# Patient Record
Sex: Female | Born: 1940
Health system: Southern US, Community
[De-identification: ages and names within clinical notes are randomized; demographics above are authoritative.]

## PROBLEM LIST (undated history)

## (undated) HISTORY — PX: ABDOMINAL HYSTERECTOMY: SHX81

---

## 2000-12-25 ENCOUNTER — Emergency Department (HOSPITAL_COMMUNITY): Admission: EM | Admit: 2000-12-25 | Discharge: 2000-12-25 | Payer: Self-pay | Admitting: Emergency Medicine

## 2000-12-25 ENCOUNTER — Encounter: Payer: Self-pay | Admitting: Emergency Medicine

## 2004-11-18 ENCOUNTER — Encounter
Admission: RE | Admit: 2004-11-18 | Discharge: 2005-02-16 | Payer: Self-pay | Admitting: Physical Medicine & Rehabilitation

## 2004-11-19 ENCOUNTER — Ambulatory Visit: Payer: Self-pay | Admitting: Physical Medicine & Rehabilitation

## 2005-01-16 ENCOUNTER — Ambulatory Visit: Payer: Self-pay | Admitting: Physical Medicine & Rehabilitation

## 2005-01-23 ENCOUNTER — Encounter
Admission: RE | Admit: 2005-01-23 | Discharge: 2005-01-23 | Payer: Self-pay | Admitting: Physical Medicine & Rehabilitation

## 2005-04-01 ENCOUNTER — Encounter
Admission: RE | Admit: 2005-04-01 | Discharge: 2005-06-30 | Payer: Self-pay | Admitting: Physical Medicine & Rehabilitation

## 2005-04-17 ENCOUNTER — Ambulatory Visit: Payer: Self-pay | Admitting: Physical Medicine & Rehabilitation

## 2006-02-22 ENCOUNTER — Ambulatory Visit (HOSPITAL_COMMUNITY): Admission: RE | Admit: 2006-02-22 | Discharge: 2006-02-22 | Payer: Self-pay | Admitting: Pulmonary Disease

## 2007-05-23 ENCOUNTER — Other Ambulatory Visit: Admission: RE | Admit: 2007-05-23 | Discharge: 2007-05-23 | Payer: Self-pay | Admitting: Obstetrics and Gynecology

## 2007-06-09 ENCOUNTER — Encounter: Admission: RE | Admit: 2007-06-09 | Discharge: 2007-06-09 | Payer: Self-pay | Admitting: Obstetrics and Gynecology

## 2008-01-04 ENCOUNTER — Encounter: Admission: RE | Admit: 2008-01-04 | Discharge: 2008-01-05 | Payer: Self-pay | Admitting: Family Medicine

## 2008-02-17 ENCOUNTER — Emergency Department (HOSPITAL_COMMUNITY): Admission: EM | Admit: 2008-02-17 | Discharge: 2008-02-17 | Payer: Self-pay | Admitting: Family Medicine

## 2008-07-28 IMAGING — CR DG ANKLE COMPLETE 3+V*R*
3 series · 3 of 3 positions shown · non-contrast
Comparison: none

CLINICAL DATA: Fell on pavement with pain. 
 RIGHT ANKLE ? 3 VIEW:

[view not recorded (1 of 3)]
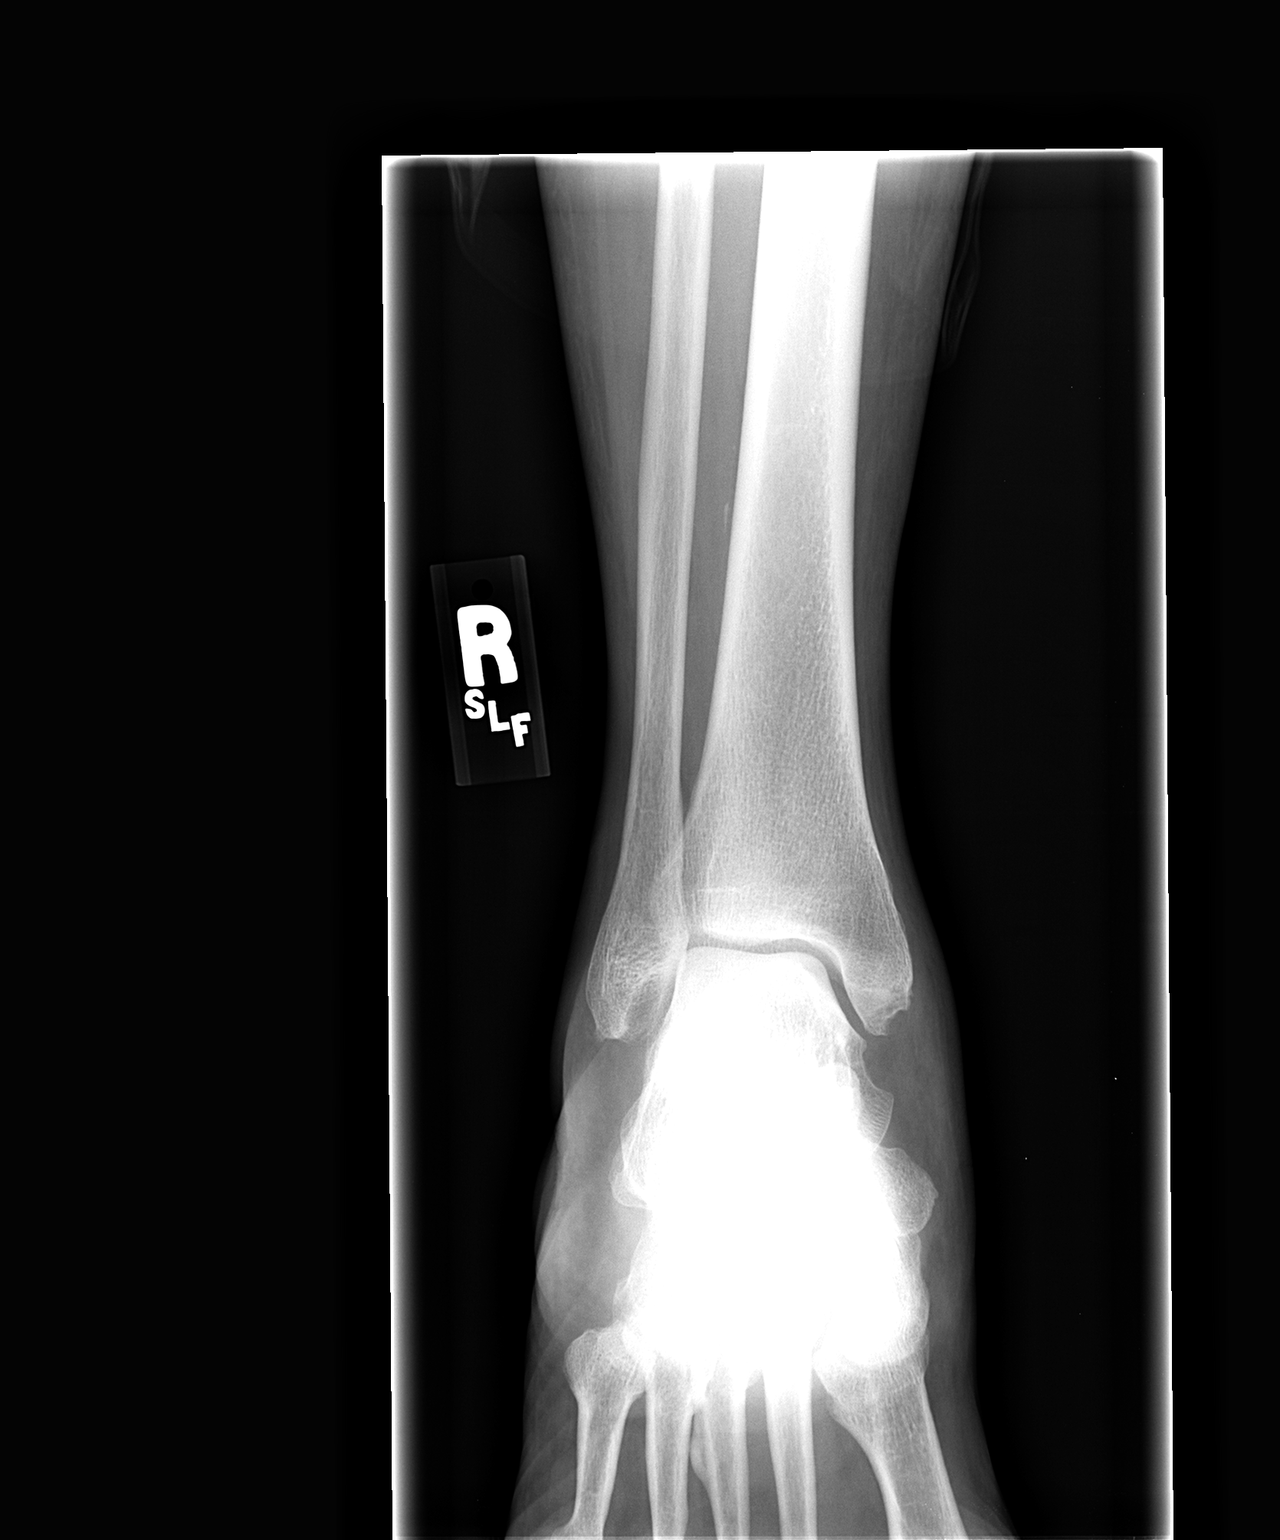

[view not recorded (2 of 3)]
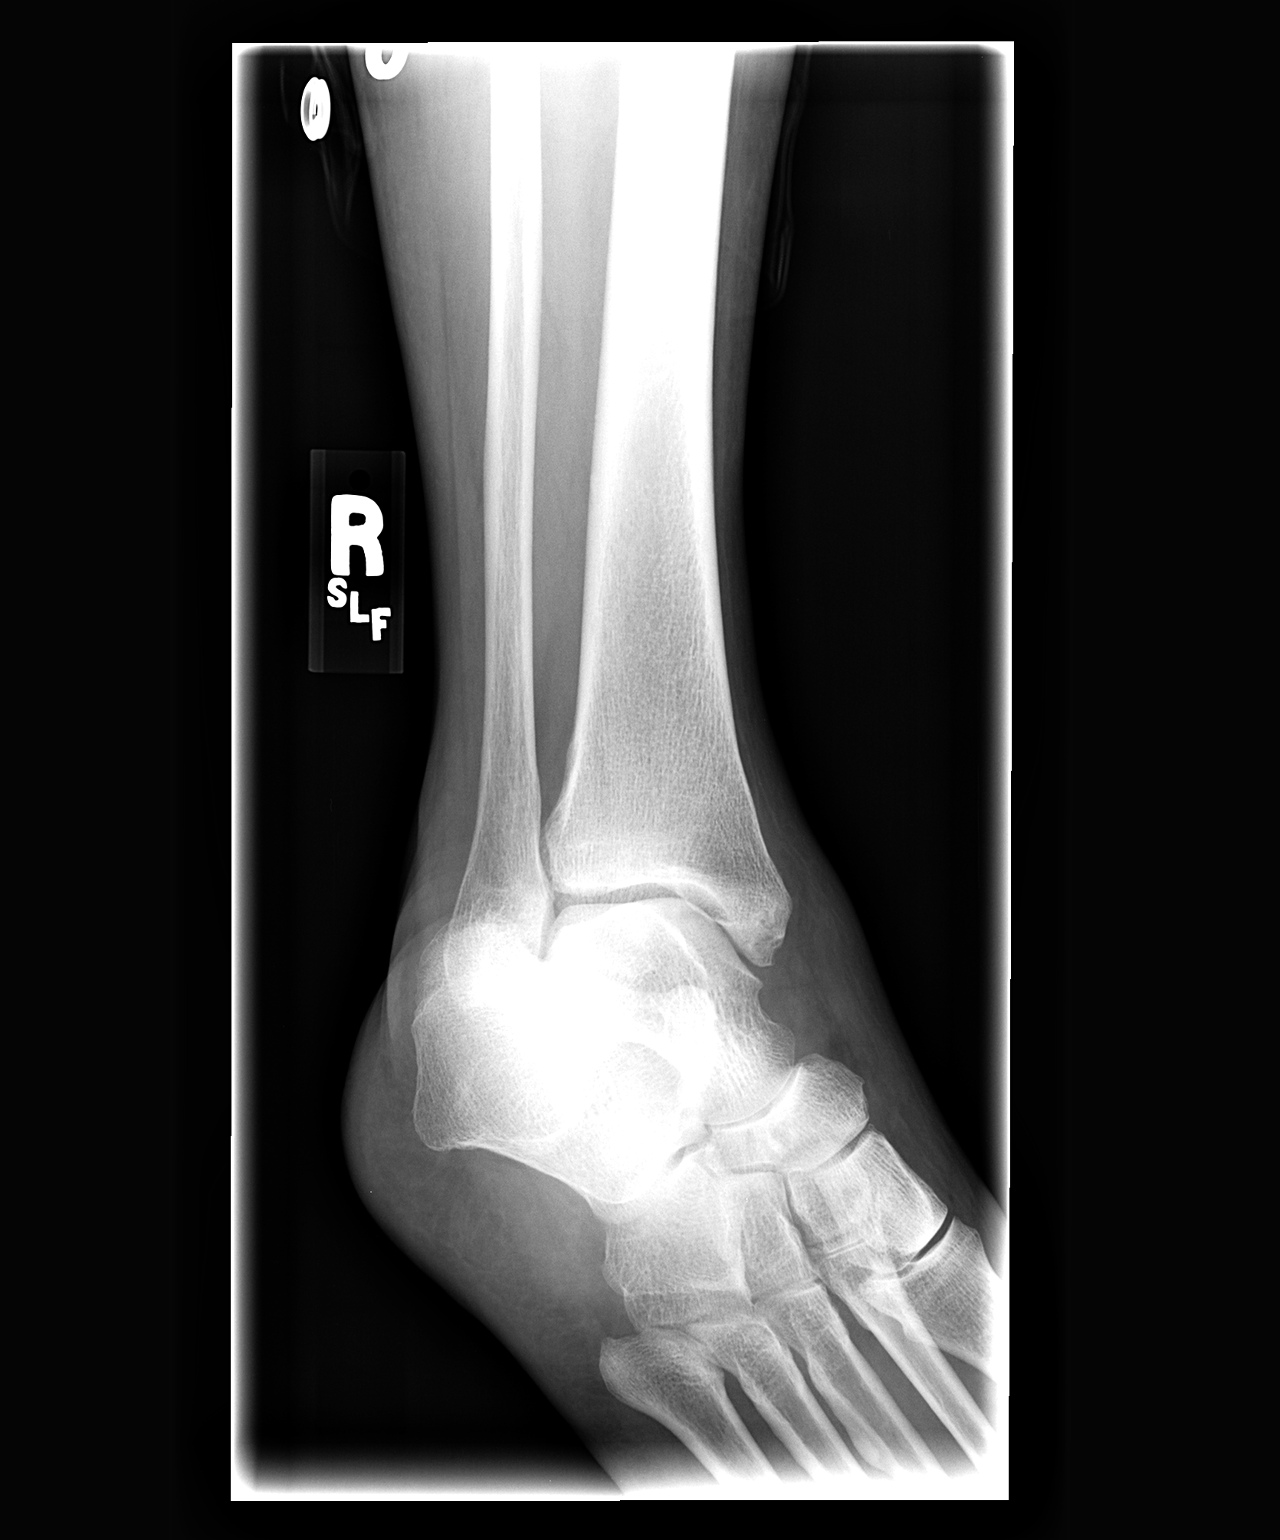

[view not recorded (3 of 3)]
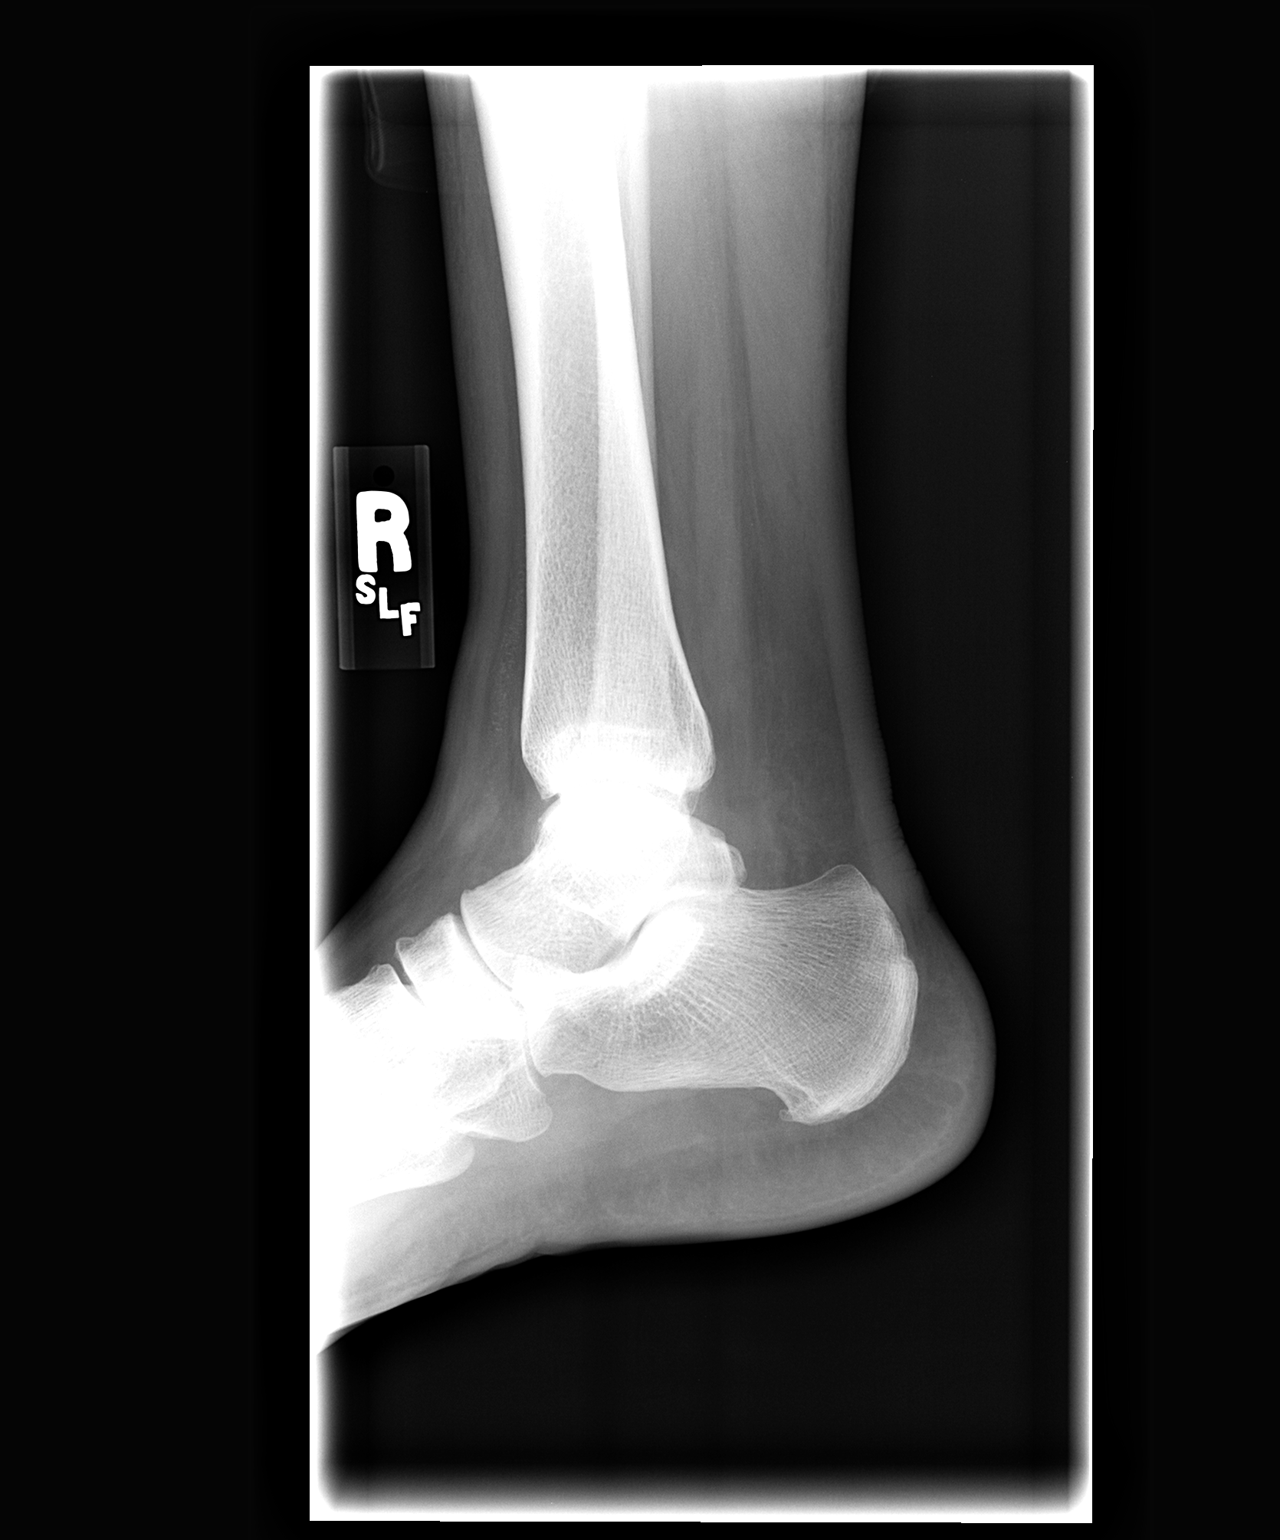

[3 of 3 positions shown; findings below may reference images not displayed]

FINDINGS: Three views of the right ankle show no acute fracture.  The ankle joint appears normal.  Alignment is normal.
IMPRESSION: Negative right ankle.

## 2009-08-03 ENCOUNTER — Emergency Department (HOSPITAL_COMMUNITY): Admission: EM | Admit: 2009-08-03 | Discharge: 2009-08-03 | Payer: Self-pay | Admitting: Emergency Medicine

## 2009-08-16 ENCOUNTER — Ambulatory Visit: Payer: Self-pay | Admitting: Women's Health

## 2009-11-01 ENCOUNTER — Ambulatory Visit: Payer: Self-pay | Admitting: Obstetrics and Gynecology

## 2009-11-06 ENCOUNTER — Ambulatory Visit: Payer: Self-pay | Admitting: Obstetrics and Gynecology

## 2009-11-06 ENCOUNTER — Other Ambulatory Visit: Admission: RE | Admit: 2009-11-06 | Discharge: 2009-11-06 | Payer: Self-pay | Admitting: Obstetrics and Gynecology

## 2011-04-17 NOTE — Assessment & Plan Note (Signed)
[name] returns to the [facility] for followup evaluation. I had seen her  previously in this office twice. Last appointment was December 19, 2004. I  first saw the patient in this office November 19, 2004. During the initial  evaluation, she reported that she was getting reasonable relief with  oxycodone medication which she was taking at night along with Vicodin  through the day. She failed to report to me that she had a fair amount of  medications remaining from her prescription with Dr. Lynnette Caffey, her primary care  physician. I did give her prescriptions during that initial evaluation in  December but she never filled them for Vicodin or oxycodone.  She also  reports that she has lost a prescription and not sure where they are at the  present time.   In the meantime, the patient has used up all pain medicines from Dr. Lynnette Caffey.  She reports that she gets all medicines through the [facility]. She has been out of the oxycodone and more specifically the Percocet  since about a week ago. She was using Percocet 7.5/500, 1 tablet q.i.d.  She  needs a refill on that in the office today. That is her only pain medicine  she takes at the present time and she reports good pain relief.   She also requests a note for work regarding work restrictions and number of  hours that she can work. Will give that to her as noted below.  Unfortunately we have also not been able to set her up physical therapy for  measurements of the lumbar support chair. Apparently specific measurements  need to be made before her employer will be able to get her the proper  chair.   MEDICATIONS:  1.  Per 7.5/500, 1 tablet q.i.d. p.r.n.  2.  Multivitamin q.d.   PHYSICAL EXAMINATION:  A well appearing fit adult female in mild acute  discomfort.  Blood pressure 144/93, pulse 72, respiratory rate 20, O2  saturation 99% on room air.  She has 5-/5 strength throughout the bilateral  upper and lower extremities.   Bulk and tone were normal and reflexes were 2+  and symmetrical.  She ambulates without any assistive device.   IMPRESSION:  1.  Lumbar spondylosis/stenosis per MRI study, August 2005.  2.  History of work related fall, July 2005.   In the office today, we did give her a work slip regarding work greater than  or equal to 5 hours as capable, limited bending and stooping and no lifting  greater than 5 pounds. We have also refilled her Percocet at 7.5/500, 1  tablet q.i.d.  She seems to understand that we need to be very careful with  all medications and need to track the medications well. She will try to  bring in the lost prescriptions if she does ever find them. She still  reports that she used only [employer]. There is a prescription bottle that  gives me four Percocet but it does not have Dr. Trula Ore name on the  prescription bottle. She is not sure how to explain that inconsistency.  Will try to keep tight control on her at the present time. She tries to make  clear to me that she does not abuse medication in any way and I hopefully  will be able to feel more comfortable with her in the near future. In the  meantime will see how she does with the Percocet that I have given her  today.      DC/MedQ  D:  01/16/2005 10:17:25  T:  01/16/2005 11:40:54  Job #:  [id number]

## 2011-04-17 NOTE — Assessment & Plan Note (Signed)
HISTORY OF PRESENT ILLNESS:  Ms. Debes returns to the clinic today for  follow up evaluation.  She is a few minutes late into the office, but we  still decided to see her.  We will have to see her as quick as possible to  stay on schedule.   The patient was last seen here January 16, 2005.  At that time, I gave her  a prescription for Percocet as she was not getting adequate relief with her  prior pain medicine including Vicodin. Unfortunately, I filled out the  prescription wrong and ordered Percocet 7.5/750.  The Tylenol dose does not  come at that strength.  Unfortunately, the pharmacy and the patient did not  call for a correction of the prescription, and instead, the patient used  Extra Strength Tylenol only over the past few months.  She brings in the  unfilled prescription into the office today, and we have destroyed that.  We  will be giving her a correct prescription in the office today.   MEDICATIONS:  1.  Extra Strength Tylenol 500 mg t.i.d. p.r.n.  2.  Multivitamin daily.   PHYSICAL EXAMINATION:  GENERAL APPEARANCE:  Well-appearing, fit, adult  female in no acute discomfort.  VITAL SIGNS:  Blood pressure and vitals were not obtained in the office.  NEUROLOGICAL:  She has 5-/5 strength throughout the bilateral upper and  lower extremities.  Bulk and tone were normal.  Reflexes were 2+ and  symmetrical.   IMPRESSION:  1.  Lumbar spondylosis/stenosis per MRI study August 2005.  2.  History of work related fall July 2005.   PLAN:  In the office today, we did give her a correct prescription for the  Percocet 7.5/500 one tablet p.o. q.i.d. p.r.n.  I have also given her a copy  of our prescription for her work chair along with a particular diagnosis,  and ICD 9 codes.  As stated, this is the chair that the patient needs to  continue work.  Hopefully, Worker's Compensation will get this chair for her  so that she can continue her work at [employer].  We will plan on  seeing the patient in follow up in approximately one month's time.      DC/MedQ  D:  04/17/2005 10:03:42  T:  04/17/2005 10:45:12  Job #:  [id number]

## 2011-04-17 NOTE — Assessment & Plan Note (Signed)
Sierra Becker returns to the clinic today for follow-up evaluation.  I first and  last saw her on November 19, 2004, on referral from Dr. Tally Joe, her primary  care physician.  She was being evaluated and treated for chronic back pain  after a work injury.   During the last clinic visit, the patient reported that she was getting fair  amount of relief with the Oxycodone medication.  She generally used only one  tablet at nighttime as she was worried about grogginess through the day.  We  decided to allow her to use Vicodin on a very sporadic basis up to three  times a day for pain during the day.  We gave her prescriptions for both the  Oxycodone and the Vicodin in the office during the last clinic visit.   The patient comes into the office today reporting that she never did fill  those two prescriptions.  She failed to report to me that she had sufficient  refills of her Oxycodone from Dr. Tally Joe, who had prescribed them for her  previously.  She is using a maximum of one tablet per day and sometimes goes  without any pain medicine.  She does use some Myoflex cream on her left  lower extremity through the day and avoids any sedation or grogginess  related to pain medicines.  She does report adequate relief from the  Oxycodone which she uses generally at night.  She has not brought any of her  pain medicines into the office today.  She is fairly sure that she some  refills on the bottle of Oxycodone that she still has from Dr. Tally Joe.  Again, she did not bring any scripts with her today, specifically the scrips  that were not filled, but were given to her during the last clinic visit.   DISCHARGE MEDICATIONS:  1.  Oxycodone 5 mg one tablet daily p.r.n.  2.  Multivitamin daily p.r.n.  3.  Vicodin (not being used).   PHYSICAL EXAMINATION:  GENERAL:  Reasonably well-appearing adult female in  mild to no acute discomfort.  VITAL SIGNS:  Blood pressure 129/81 with a pulse of 70, respiratory rate  18,  and O2 saturation 100% on room air.  NEUROLOGY:  She has 5-/5 strength throughout the bilateral lower  extremities.  Bulk and tone were normal and reflexes were 2+ and  symmetrical.  Sensation was intact to light touch throughout the bilateral  lower extremities.   IMPRESSION:  1.  Lumbar spondylosis/stenosis per MRI study in August of 2005.  2.  History of work-related fall in July of 2005.   In the clinic today, I have asked the patient to return the unfilled  prescriptions for the Oxycodone and Vicodin that I gave her during the last  clinic visit.  We will need to destroy those when she brings them in.  I  have also asked her to bring in the bottle of the Oxycodone that she is  still using from Dr. Tally Joe so that we can see exactly which pharmacy she  uses and see how many refills she has.  We will need to prescribe medication  for her in the future, but at the present time she seems to have adequate  Oxycodone.  She is not interested in using the Vicodin and I have therefore  asked her to return that prescription so that we can destroy that.  We plan  on seeing the patient in follow-up in approximately one months' time.  We  have asked her also to have a lumbar corset brace and she will be seeking to  get that through a mail-in company.  We have also set her up with physical  therapists for measurements for a lumbar support chair that she will use at  her work space.  Dr. Tally Joe had previously written for the chair, but it was  generic without specific measurements.  Her employer apparently needs  specific measurements so that they can get her the proper chair that she  needs.   We will plan on seeing her in follow-up in approximately one months' time.       DC/MedQ  D:  12/19/2004 11:14:14  T:  12/19/2004 11:35:46  Job #:  [id number]

## 2011-04-17 NOTE — Group Therapy Note (Signed)
REFERRING PHYSICIAN:  Dr. Lynnette Caffey (primary care physician).   PURPOSE OF EVALUATION:  Evaluate and treat chronic back pain after a work  injury.   HISTORY OF PRESENT ILLNESS:  Sierra Becker is a 70 year old adult female  referred to this office by Dr. Lynnette Caffey, her primary care physician, for  treatment of low back pain resulting from a work injury.  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  She reports that  she fell out of a rolling desk chair that had wheels.  She reports that she  was seen at urgent care and x-rays showed some unusual abnormality, although  she is not sure.  She subsequently went on to have an MRI scan of her lumbar  spine done July 09, 2004.  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  Multi level degenerative spondylotic  changes were present.  The lateral recess and foraminal stenosis on the left  at L3-L4 was present, in part due to disk protrusion.  There was also  encroachment on the right L2 nerve root laterally.  There was a small  shallow disk protrusion laterally on the left at L2-L3 and to a greater  degree laterally on the left at L1-L2.  Moderate central canal stenosis was  present at L2-L3 and L3-L4.  The patient reports that after the studies, she  has been tried on a combination of water and land therapy.  She reports that  neither of these gave her much relief.  She also reports having used a TENS  unit but apparently the present TENS unit that she has had one channel that  is not working.  She apparently has also been tried on Ultram along with  Celebrex and a Medrol dose pack along with Percocet in the past.  She also  reports having taken oxycodone initially for 45 days while she was out of  work.  The patient reports that presently she gets  good relief from the  oxycodone that she takes one tablet at night.  She unfortunately can not  work through the day on the oxycodone medication and she needs something  slightly weaker that she can use during the day for pain.  She reports that  she does not want any injections at the present time.  She reports that she  has to change positions frequently and has work restrictions as noted below.  She also reports trouble sitting for any prolonged period of time.  On  October 16, 2004, Dr. Lynnette Caffey saw her and asked her to continue the same work  restrictions, i.e., no bending or stooping, and no lifting greater than 5  pounds.  She was allowed greater than or equal to five hours of work per day  as tolerated.  She generally has been working 5+ hours a day four days per  week at this point.  She has some restrictions as noted above.  Presently,  the patient reports pain across her lumbar region down her left leg and down  her backside but not below knee level.  She also complains of occasional  numbness of her left leg secondary to this pain.  The patient reports that  the TENS unit has given her some relief in the past.  She would like to get  a new TENS  unit if at all possible but that has been slow to proceed through  her worker's comp carrier.  She reports that she has contacted Corky Sox at the [place] office in [place] for most of her worker comp  issues.   MEDICATIONS:  Oxycodone 5 mg 0-1 tablets per day.   PAST MEDICAL HISTORY:  History of oral surgery in the past.   ALLERGIES:  No known drug allergies.   FAMILY HISTORY:  The patient reports congestive heart failure in an elderly  grandparent.  She reports that her father is healthy and working at [employer].  She reports that her mother died at age 29 from unknown  causes.   SOCIAL HISTORY:  The patient works as a Librarian, academic for [employer].  She works  5 to 5+ hours per day four days per week.  She is  married with four grown  children.  She does not use alcohol or tobacco.   REVIEW OF SYSTEMS:  None marked.   PHYSICAL EXAMINATION:  GENERAL:  A well-appearing, fit, adult female in mild  acute discomfort.  VITAL SIGNS:  Blood pressure 118/73 with a pulse of 81, respiratory rate 20,  and O2 saturation 99% on room air.  MUSCULOSKELETAL:  The patient has normal walking abilities.  She does not  use any assistive device during ambulation.  She has some trouble toe  walking and heel walking on the left.  Upper extremity range of motion was  decreased with complaints of pain throughout her entire upper back and mid  back.  Strength was 4/5 throughout the bilateral upper extremities and  reflexes were 2+ and symmetrical.  Sensation was intact to light touch  throughout the bilateral upper extremities.  Lower extremity exam showed  normal bulk and tone throughout.  Reflexes were 2+ and symmetrical and  sensation was intact to light touch throughout.  Strength was 4/5 in hip  flexion, knee extension, and ankle dorsiflexion.  In the supine position,  straight leg raise was negative bilaterally and hip range of motion was  within functional limits.   IMPRESSION:  1.  Lumbar spondylosis/stenosis per MRI study, August 2005.  2.  History of work related fall, June 05, 2004.   At the present time,  1.  We have decided to continue her oxycodone which she uses at night and      gets good relief from.  2.  We have decided to allow her to use Vicodin 5/500 one tablet p.o. t.i.d.      during the day as needed for pain that comes about and for which she can      not take the oxycodone secondary to grogginess.  3.  We will also try to get her a new TENS unit as that has provided her      with benefit in the past.  4.  She is not interested in injections and she reports that therapy has      actually only caused her increased pain and not relieved any pain to any      significant degree. 5.  We will  plan on seeing the patient in followup in this office in      approximately one month's time.      DC/MedQ  D:  11/19/2004 14:37:55  T:  11/19/2004 17:20:02  Job #:  [id number]

## 2011-12-09 ENCOUNTER — Ambulatory Visit (INDEPENDENT_AMBULATORY_CARE_PROVIDER_SITE_OTHER): Payer: BC Managed Care – PPO | Admitting: Women's Health

## 2011-12-09 ENCOUNTER — Telehealth: Payer: Self-pay | Admitting: *Deleted

## 2011-12-09 ENCOUNTER — Encounter: Payer: Self-pay | Admitting: Women's Health

## 2011-12-09 VITALS — BP 138/92 | Ht 65.5 in | Wt 189.0 lb

## 2011-12-09 DIAGNOSIS — Z13220 Encounter for screening for lipoid disorders: Secondary | ICD-10-CM

## 2011-12-09 DIAGNOSIS — B372 Candidiasis of skin and nail: Secondary | ICD-10-CM

## 2011-12-09 DIAGNOSIS — R32 Unspecified urinary incontinence: Secondary | ICD-10-CM

## 2011-12-09 DIAGNOSIS — Z780 Asymptomatic menopausal state: Secondary | ICD-10-CM

## 2011-12-09 DIAGNOSIS — E079 Disorder of thyroid, unspecified: Secondary | ICD-10-CM

## 2011-12-09 DIAGNOSIS — Z01419 Encounter for gynecological examination (general) (routine) without abnormal findings: Secondary | ICD-10-CM

## 2011-12-09 LAB — URINALYSIS, ROUTINE W REFLEX MICROSCOPIC
Bilirubin Urine: NEGATIVE
Hgb urine dipstick: NEGATIVE
Ketones, ur: NEGATIVE mg/dL
Protein, ur: NEGATIVE mg/dL
Urobilinogen, UA: 0.2 mg/dL (ref 0.0–1.0)

## 2011-12-09 LAB — WET PREP, GENITAL
Clue Cells Wet Prep HPF POC: NONE SEEN
Trich, Wet Prep: NONE SEEN

## 2011-12-09 MED ORDER — FLUCONAZOLE 150 MG PO TABS: 150.0000 mg | ORAL_TABLET | Freq: Once | ORAL | Status: AC

## 2011-12-09 NOTE — Telephone Encounter (Signed)
Telephone call, will schedule office visit at [facility] urgent care with another provider. Will have labs faxed prior to OV.

## 2011-12-09 NOTE — Progress Notes (Signed)
Sierra Becker 1941/07/03 [id number]    History:    The patient presents for annual exam.  Has numerous complaints. Has not been seen in our office in 2 years. Has had some problems with urinary incontinence for greater than 4 months, wearing a pad that has caused some skin irritation. Denies any pain at the end of the stream, increased frequency and urgency, nocturia x4-5 times. Also states has had some swelling in her ankles for greater than 4 months with occasional shortness of breath and increased fatigue. Has not seen a primary care. History of a hysterectomy for fibroids. Last mammogram was several years ago, normal. Had a normal colonoscopy in 2010. Has not had a bone density. Currently on no medication other than a vitamin.   Past medical history, past surgical history, family history and social history were all reviewed and documented in the EPIC chart. Works full-time Tree surgeon disability. Mother died at age 23 unknown cause. Father age 76.   ROS:  A  ROS was performed and pertinent positives and negatives are included in the history.  Exam:  Filed Vitals:   12/09/11 0857  BP: 138/92    General appearance:  Normal appears well Head/Neck:  Normal, without cervical or supraclavicular adenopathy. Thyroid:  Symmetrical, normal in size, without palpable masses or nodularity. Respiratory  Effort:  Normal  Auscultation:  Clear without wheezing or rhonchi Cardiovascular  Auscultation:  Regular rate, without rubs, murmurs or gallops  Edema/varicosities:  +1 edema pedal bilat Abdominal  Soft,nontender, without masses, guarding or rebound.  Liver/spleen:  No organomegaly noted  Hernia:  None appreciated  Skin  Inspection:  Grossly normal  Palpation:  Grossly normal Neurologic/psychiatric  Orientation:  Normal with appropriate conversation.  Mood/affect:  Normal  Genitourinary    Breasts: Examined lying and sitting/inverted nipples bilat.     Right: Without masses,  retractions, discharge or axillary adenopathy.     Left: Without masses, retractions, discharge or axillary adenopathy.   Inguinal/mons:  Normal without inguinal adenopathy  External genitalia:  Normal  BUS/Urethra/Skene's glands:  Normal  Bladder:  Normal  Vagina:  Normal  Cervix:               Uterus:  absent  Adnexa/parametria:     Rt: Without masses or tenderness.   Lt: Without masses or tenderness.  Anus and perineum: Normal  Digital rectal exam: Normal sphincter tone without palpated masses or tenderness  Assessment/Plan:  71 y.o. W BF G4 P4 for annual exam.    Urinary incontinence x3-4 months with pruritus Pedal edema/occasional SOB/fatigue greater than 4 months Hysterectomy/fibroids/never ERT. Yeast  Plan: Diflucan 150 by mouth x1 dose, nystatin cream to external genitalia. UA and culture, if negative proceed with urodynamics. Blood pressure 132/92, +1 pedal edema bilaterally, states occasional shortness of breath and fatigue. Reviewed importance of primary care will schedule an appointment at [facility] urgent care ASAP. Also reviewed needed vaccine's of zostovac, Pneumovax, Dtap. Will schedule bone density here, reviewed importance of vitamin D 2000 daily, fall prevention and home safety discussed. CBC, CMET, lipid profile, TSH, UA Harrington Challenger WHNP, 9:54 AM 12/09/2011

## 2011-12-09 NOTE — Telephone Encounter (Signed)
Pt was seen today, she said the PCP(Dr. Pouska)  at [facility] is no longer worker and has not been in years. Pt would like name of another doctor. Please advise

## 2011-12-10 ENCOUNTER — Ambulatory Visit: Payer: Self-pay | Admitting: Women's Health

## 2011-12-10 ENCOUNTER — Telehealth: Payer: Self-pay | Admitting: Women's Health

## 2011-12-10 DIAGNOSIS — N390 Urinary tract infection, site not specified: Secondary | ICD-10-CM

## 2011-12-10 LAB — COMPREHENSIVE METABOLIC PANEL
ALT: 15 U/L (ref 0–35)
AST: 20 U/L (ref 0–37)
Albumin: 4.4 g/dL (ref 3.5–5.2)
Alkaline Phosphatase: 77 U/L (ref 39–117)
Potassium: 3.4 mEq/L — ABNORMAL LOW (ref 3.5–5.3)
Sodium: 141 mEq/L (ref 135–145)
Total Bilirubin: 0.6 mg/dL (ref 0.3–1.2)
Total Protein: 7.7 g/dL (ref 6.0–8.3)

## 2011-12-10 LAB — CBC WITH DIFFERENTIAL/PLATELET
Basophils Absolute: 0 10*3/uL (ref 0.0–0.1)
Eosinophils Absolute: 0.1 10*3/uL (ref 0.0–0.7)
Eosinophils Relative: 3 % (ref 0–5)
MCH: 28.1 pg (ref 26.0–34.0)
MCHC: 30.9 g/dL (ref 30.0–36.0)
MCV: 90.8 fL (ref 78.0–100.0)
Monocytes Absolute: 0.4 10*3/uL (ref 0.1–1.0)
Platelets: 286 10*3/uL (ref 150–400)
RDW: 13.8 % (ref 11.5–15.5)
WBC: 4.8 10*3/uL (ref 4.0–10.5)

## 2011-12-10 LAB — LIPID PANEL
HDL: 63 mg/dL (ref >39)
LDL Cholesterol: 132 mg/dL — ABNORMAL HIGH (ref 0–99)
Total CHOL/HDL Ratio: 3.3 Ratio

## 2011-12-10 MED ORDER — NITROFURANTOIN MONOHYD MACRO 100 MG PO CAPS: 100.0000 mg | ORAL_CAPSULE | Freq: Two times a day (BID) | ORAL | Status: AC

## 2011-12-10 NOTE — Telephone Encounter (Signed)
Left message on cell urine culture positive, will call in medication to pharmacy, start today take twice daily with food.

## 2011-12-14 ENCOUNTER — Ambulatory Visit: Payer: Self-pay | Admitting: Internal Medicine

## 2011-12-24 ENCOUNTER — Other Ambulatory Visit: Payer: Self-pay | Admitting: Women's Health

## 2011-12-24 DIAGNOSIS — N390 Urinary tract infection, site not specified: Secondary | ICD-10-CM

## 2012-01-13 ENCOUNTER — Other Ambulatory Visit: Payer: Self-pay | Admitting: Obstetrics and Gynecology

## 2012-01-13 DIAGNOSIS — Z13820 Encounter for screening for osteoporosis: Secondary | ICD-10-CM

## 2013-02-09 ENCOUNTER — Emergency Department (HOSPITAL_COMMUNITY)
Admission: EM | Admit: 2013-02-09 | Discharge: 2013-02-09 | Disposition: A | Discharge status: Home / Self Care | Payer: Federal, State, Local not specified - PPO | Attending: Emergency Medicine | Admitting: Emergency Medicine

## 2013-02-09 ENCOUNTER — Encounter (HOSPITAL_COMMUNITY): Payer: Self-pay | Admitting: *Deleted

## 2013-02-09 DIAGNOSIS — Y92410 Unspecified street and highway as the place of occurrence of the external cause: Secondary | ICD-10-CM | POA: Insufficient documentation

## 2013-02-09 DIAGNOSIS — S43499A Other sprain of unspecified shoulder joint, initial encounter: Secondary | ICD-10-CM | POA: Insufficient documentation

## 2013-02-09 DIAGNOSIS — S0990XA Unspecified injury of head, initial encounter: Secondary | ICD-10-CM | POA: Insufficient documentation

## 2013-02-09 DIAGNOSIS — R209 Unspecified disturbances of skin sensation: Secondary | ICD-10-CM | POA: Insufficient documentation

## 2013-02-09 DIAGNOSIS — Y9389 Activity, other specified: Secondary | ICD-10-CM | POA: Insufficient documentation

## 2013-02-09 DIAGNOSIS — S0993XA Unspecified injury of face, initial encounter: Secondary | ICD-10-CM | POA: Insufficient documentation

## 2013-02-09 DIAGNOSIS — IMO0002 Reserved for concepts with insufficient information to code with codable children: Secondary | ICD-10-CM | POA: Insufficient documentation
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
## 2013-02-10 NOTE — ED Provider Notes (Signed)
Medical screening examination/treatment/procedure(s) were performed by non-physician practitioner and as supervising physician I was immediately available for consultation/collaboration.   Dione Booze, MD 02/10/13 [phone]

## 2014-10-01 ENCOUNTER — Encounter (HOSPITAL_COMMUNITY): Payer: Self-pay | Admitting: *Deleted

## 2015-03-15 ENCOUNTER — Other Ambulatory Visit: Payer: Self-pay | Admitting: Family Medicine

## 2015-03-15 DIAGNOSIS — Z1231 Encounter for screening mammogram for malignant neoplasm of breast: Secondary | ICD-10-CM

## 2015-03-15 DIAGNOSIS — Z780 Asymptomatic menopausal state: Secondary | ICD-10-CM

## 2015-03-21 ENCOUNTER — Ambulatory Visit: Payer: Federal, State, Local not specified - PPO

## 2015-03-21 ENCOUNTER — Other Ambulatory Visit: Payer: Federal, State, Local not specified - PPO

## 2015-04-11 ENCOUNTER — Inpatient Hospital Stay: Admission: RE | Admit: 2015-04-11 | Payer: Federal, State, Local not specified - PPO | Source: Ambulatory Visit

## 2015-04-11 ENCOUNTER — Ambulatory Visit: Payer: Federal, State, Local not specified - PPO

## 2016-03-11 ENCOUNTER — Ambulatory Visit: Payer: Federal, State, Local not specified - PPO | Admitting: Family Medicine

## 2016-04-28 DIAGNOSIS — K219 Gastro-esophageal reflux disease without esophagitis: Secondary | ICD-10-CM | POA: Insufficient documentation

## 2018-02-18 ENCOUNTER — Other Ambulatory Visit: Payer: Self-pay

## 2018-02-18 ENCOUNTER — Ambulatory Visit
Admission: EM | Admit: 2018-02-18 | Discharge: 2018-02-18 | Disposition: A | Discharge status: Home / Self Care | Payer: Medicare Other | Attending: Family Medicine | Admitting: Family Medicine

## 2018-02-18 DIAGNOSIS — D649 Anemia, unspecified: Secondary | ICD-10-CM | POA: Insufficient documentation

## 2018-02-18 DIAGNOSIS — E538 Deficiency of other specified B group vitamins: Secondary | ICD-10-CM | POA: Insufficient documentation

## 2018-02-18 DIAGNOSIS — S161XXA Strain of muscle, fascia and tendon at neck level, initial encounter: Secondary | ICD-10-CM

## 2018-02-18 MED ORDER — IBUPROFEN 600 MG PO TABS
600.0000 mg | ORAL_TABLET | Freq: Once | ORAL | Status: AC
  Administered 2018-02-18: 600 mg via ORAL

## 2018-02-18 MED ORDER — CYCLOBENZAPRINE HCL 5 MG PO TABS: 5.0000 mg | ORAL_TABLET | Freq: Every day | ORAL | 0 refills | Status: AC

## 2018-02-18 NOTE — ED Triage Notes (Signed)
Patient complains of neck pain. Patient states that it is along the left side. Patient states that she is unable to turn neck.

## 2018-02-18 NOTE — ED Provider Notes (Signed)
[facility]    CSN: [id number] Arrival date & time: 02/18/18  1047     History   Chief Complaint Chief Complaint  Patient presents with  . Neck Pain    HPI Sierra Becker is a 77 y.o. female.   The history is provided by the patient.  Neck Pain  Pain location:  L side Quality:  Aching Pain radiates to:  L shoulder Pain severity:  Moderate Pain is:  Same all the time Onset quality:  Sudden Duration:  1 day Timing:  Constant Progression:  Unchanged Chronicity:  New Context: not fall, not jumping from heights, not lifting a heavy object, not MCA, not MVC, not pedestrian accident and not recent injury   Relieved by:  None tried Worsened by:  Twisting and position Ineffective treatments:  None tried Associated symptoms: no bladder incontinence, no bowel incontinence, no chest pain, no fever, no headaches, no leg pain, no numbness, no paresis, no photophobia, no syncope, no tingling, no visual change, no weakness and no weight loss   Risk factors: no hx of spinal trauma, no recent head injury and no recurrent falls     History reviewed. No pertinent past medical history.  Patient Active Problem List   Diagnosis Date Noted  . Deficiency of other specified B group vitamins 02/18/2018  . Anemia, unspecified 02/18/2018  . Gastro-esophageal reflux disease without esophagitis 04/28/2016    Past Surgical History:  Procedure Laterality Date  . ABDOMINAL HYSTERECTOMY      OB History    Gravida  4   Para  4   Term      Preterm      AB      Living        SAB      TAB      Ectopic      Multiple      Live Births               Home Medications    Prior to Admission medications   Medication Sig Start Date End Date Taking? Authorizing Provider  hydrocortisone 2.5 % lotion Apply topically. 05/25/17 05/25/18 Yes [provider]  Multiple Vitamins-Minerals (MULTIVITAL PO) Take by mouth.   Yes [provider]  cyclobenzaprine  (FLEXERIL) 5 MG tablet Take 1 tablet (5 mg total) by mouth at bedtime. 02/18/18   Payton Mccallumonty, Cassady Turano, MD  Multiple Vitamins-Minerals (MULTIVITAMIN PO) Take 1 tablet by mouth daily.     [provider]    Family History Family History  Problem Relation Age of Onset  . Diabetes Maternal Aunt   . Diabetes Maternal Grandmother   . Heart disease Father     Social History Social History   Tobacco Use  . Smoking status: Never Smoker  . Smokeless tobacco: Never Used  Substance Use Topics  . Alcohol use: No  . Drug use: Never     Allergies   Other   Review of Systems Review of Systems  Constitutional: Negative for fever and weight loss.  Eyes: Negative for photophobia.  Cardiovascular: Negative for chest pain and syncope.  Gastrointestinal: Negative for bowel incontinence.  Genitourinary: Negative for bladder incontinence.  Musculoskeletal: Positive for neck pain.  Neurological: Negative for tingling, weakness, numbness and headaches.     Physical Exam Triage Vital Signs ED Triage Vitals  Enc Vitals Group     BP 02/18/18 1103 (!) 161/83     Pulse Rate 02/18/18 1103 90     Resp 02/18/18  1103 18     Temp 02/18/18 1103 98.5 F (36.9 C)     Temp Source 02/18/18 1103 Oral     SpO2 02/18/18 1103 99 %     Weight 02/18/18 1100 175 lb (79.4 kg)     Height 02/18/18 1100 5' 5.5" (1.664 m)     Head Circumference --      Peak Flow --      Pain Score 02/18/18 1100 10     Pain Loc --      Pain Edu? --      Excl. in GC? --    No data found.  Updated Vital Signs BP (!) 161/83 (BP Location: Left Arm)   Pulse 90   Temp 98.5 F (36.9 C) (Oral)   Resp 18   Ht 5' 5.5" (1.664 m)   Wt 175 lb (79.4 kg)   SpO2 99%   BMI 28.68 kg/m   Visual Acuity Right Eye Distance:   Left Eye Distance:   Bilateral Distance:    Right Eye Near:   Left Eye Near:    Bilateral Near:     Physical Exam  Constitutional: She appears well-developed and well-nourished. No distress.    Musculoskeletal:       Cervical back: She exhibits decreased range of motion, tenderness and spasm. She exhibits no bony tenderness, no swelling, no edema, no deformity, no laceration, no pain and normal pulse.  Skin: She is not diaphoretic.  Nursing note and vitals reviewed.    UC Treatments / Results  Labs (all labs ordered are listed, but only abnormal results are displayed) Labs Reviewed - No data to display  EKG  EKG Interpretation None       Radiology No results found.  Procedures Procedures (including critical care time)  Medications Ordered in UC Medications  ibuprofen (ADVIL,MOTRIN) tablet 600 mg (600 mg Oral Given 02/18/18 1223)     Initial Impression / Assessment and Plan / UC Course  I have reviewed the triage vital signs and the nursing notes.  Pertinent labs & imaging results that were available during my care of the patient were reviewed by me and considered in my medical decision making (see chart for details).       Final Clinical Impressions(s) / UC Diagnoses   Final diagnoses:  Acute strain of neck muscle, initial encounter    ED Discharge Orders        Ordered    cyclobenzaprine (FLEXERIL) 5 MG tablet  Daily at bedtime     02/18/18 1220     1. diagnosis reviewed with patient 2. rx as per orders above; reviewed possible side effects, interactions, risks and benefits  3. Recommend supportive treatment with rest, otc nsaids prn, heat, stretch 4. Follow-up prn if symptoms worsen or don't improve  Controlled Substance Prescriptions [place] Controlled Substance Registry consulted? Not Applicable   Payton Mccallum, MD 02/18/18 1259

## 2018-02-18 NOTE — Discharge Instructions (Addendum)
Advil ( 3 tablets three times daily as needed for pain), take with food Heat to the area

## 2018-03-30 DIAGNOSIS — M67371 Transient synovitis, right ankle and foot: Secondary | ICD-10-CM | POA: Diagnosis not present

## 2018-03-30 DIAGNOSIS — M19071 Primary osteoarthritis, right ankle and foot: Secondary | ICD-10-CM | POA: Diagnosis not present

## 2018-03-30 DIAGNOSIS — M659 Synovitis and tenosynovitis, unspecified: Secondary | ICD-10-CM | POA: Diagnosis not present

## 2018-06-16 DIAGNOSIS — M79641 Pain in right hand: Secondary | ICD-10-CM | POA: Diagnosis not present

## 2018-06-16 DIAGNOSIS — M1280 Other specific arthropathies, not elsewhere classified, unspecified site: Secondary | ICD-10-CM | POA: Diagnosis not present

## 2018-06-23 DIAGNOSIS — R51 Headache: Secondary | ICD-10-CM | POA: Diagnosis not present

## 2018-06-23 DIAGNOSIS — M542 Cervicalgia: Secondary | ICD-10-CM | POA: Diagnosis not present

## 2018-06-27 DIAGNOSIS — M25512 Pain in left shoulder: Secondary | ICD-10-CM | POA: Diagnosis not present

## 2018-06-27 DIAGNOSIS — R51 Headache: Secondary | ICD-10-CM | POA: Diagnosis not present

## 2018-07-29 ENCOUNTER — Ambulatory Visit
Admission: EM | Admit: 2018-07-29 | Discharge: 2018-07-29 | Disposition: A | Discharge status: Home / Self Care | Payer: Medicare Other | Attending: Family Medicine | Admitting: Family Medicine

## 2018-07-29 DIAGNOSIS — R21 Rash and other nonspecific skin eruption: Secondary | ICD-10-CM

## 2018-07-29 MED ORDER — TRIAMCINOLONE ACETONIDE 0.025 % EX OINT: 1.0000 "application " | TOPICAL_OINTMENT | Freq: Two times a day (BID) | CUTANEOUS | 0 refills | Status: AC

## 2018-07-29 NOTE — ED Provider Notes (Addendum)
[facility] ____________________________________________  Time seen: Approximately 4:31 PM  I have reviewed the triage vital signs and the nursing notes.   HISTORY  Chief Complaint Rash   HPI Sierra Becker is a 77 y.o. female presenting for evaluation of rash that is present to her neck and face intermittently for several months.  States that she feels that this is triggered by heat as it only occurs when she gets over heated.  States that the rash improves with topical hydrocortisone and that does help with the itching, but itching does continue as well as rash.  Denies any changes in foods, medicines, lotions, detergents or other contacts.  States the rash will fully resolve on its own but returns after getting overheated.  States rash is nowhere else on the body. States rash started today. No insect bites or break in skin.  States this has been present intermittently throughout the summer as well as possibly last year.  States did also try some other over-the-counter topical anti-itch cream which helps slightly but similar to hydrocortisone.  Denies any other alleviating measures attempted.  Has not taken any oral agents.  Denies any associated swelling, pain or drainage.  No sore throat, oropharyngeal edema.  States otherwise feels well denies other complaints. Denies recent sickness. Denies recent antibiotic use.  States that she does have eczema history is child with some similar rash.  [place]: PCP   History reviewed. No pertinent past medical history.  Patient Active Problem List   Diagnosis Date Noted  . Deficiency of other specified B group vitamins 02/18/2018  . Anemia, unspecified 02/18/2018  . Gastro-esophageal reflux disease without esophagitis 04/28/2016    Past Surgical History:  Procedure Laterality Date  . ABDOMINAL HYSTERECTOMY       No current facility-administered medications for this encounter.   Current Outpatient Medications:   Marland Kitchen.  Multiple Vitamins-Minerals (MULTIVITAMIN PO), Take 1 tablet by mouth daily. , Disp: , Rfl:  .  cyclobenzaprine (FLEXERIL) 5 MG tablet, Take 1 tablet (5 mg total) by mouth at bedtime., Disp: 30 tablet, Rfl: 0 .  Multiple Vitamins-Minerals (MULTIVITAL PO), Take by mouth., Disp: , Rfl:  .  triamcinolone (KENALOG) 0.025 % ointment, Apply 1 application topically 2 (two) times daily., Disp: 30 g, Rfl: 0  Allergies Other  Family History  Problem Relation Age of Onset  . Diabetes Maternal Aunt   . Diabetes Maternal Grandmother   . Heart disease Father     Social History Social History   Tobacco Use  . Smoking status: Never Smoker  . Smokeless tobacco: Never Used  Substance Use Topics  . Alcohol use: No  . Drug use: Never    Review of Systems Constitutional: No fever/chills ENT: No sore throat. Cardiovascular: Denies chest pain. Respiratory: Denies shortness of breath. Gastrointestinal: No abdominal pain.   Skin: as above.  ____________________________________________   PHYSICAL EXAM:  VITAL SIGNS: ED Triage Vitals  Enc Vitals Group     BP 07/29/18 1538 139/84     Pulse Rate 07/29/18 1538 80     Resp 07/29/18 1538 18     Temp 07/29/18 1538 98.5 F (36.9 C)     Temp Source 07/29/18 1538 Oral     SpO2 07/29/18 1538 97 %     Weight --      Height --      Head Circumference --      Peak Flow --      Pain Score 07/29/18 1540 0  Pain Loc --      Pain Edu? --      Excl. in GC? --     Constitutional: Alert and oriented. Well appearing and in no acute distress. Eyes: Conjunctivae are normal.  ENT      Head: Normocephalic and atraumatic.  No edema.      Nose: No congestion      Mouth/Throat: Mucous membranes are moist.Oropharynx non-erythematous.  No lip, tongue, oropharyngeal edema noted. Neck: No stridor. Supple without meningismus. . Cardiovascular: Normal rate, regular rhythm. Grossly normal heart sounds.  Good peripheral circulation. Respiratory: Normal  respiratory effort without tachypnea nor retractions. Breath sounds are clear and equal bilaterally. No wheezes, rales, rhonchi. Musculoskeletal: Steady gait.  No extremity edema noted. Neurologic:  Normal speech and language.  Speech is normal. No gait instability.  Skin:  Skin is warm, dry.  Except: Very few scattered to neck as well as face minimally erythematous papular rash with small clustered area on chin, diffuse bilaterally but scant, nontender, pruritic, no drainage, nonvesicular, no other rash noted. Psychiatric: Mood and affect are normal. Speech and behavior are normal. Patient exhibits appropriate insight and judgment   ___________________________________________   LABS (all labs ordered are listed, but only abnormal results are displayed)  Labs Reviewed - No data to display   PROCEDURES Procedures    INITIAL IMPRESSION / ASSESSMENT AND PLAN / ED COURSE  Pertinent labs & imaging results that were available during my care of the patient were reviewed by me and considered in my medical decision making (see chart for details).  Very well-appearing patient.  No acute distress.  Patient presenting for evaluation of itchy rash that has been coming and going intermittently.  Patient denies any allergy triggers, but states that associated to when she gets overheated. Patient with very mild appearing rash, and patient further states that rash will eventually go away, but it is just red itchy.  Will treat with topical triamcinolone as needed.  Encourage supportive care and avoidance of scratching as well as to continue to monitor for triggers.  Patient declines oral agents at this time. Discussed indication, risks and benefits of medications with patient.  Discussed follow up with Primary care physician this week as needed. Discussed follow up and return parameters including no resolution or any worsening concerns. Patient verbalized understanding and agreed to plan.    ____________________________________________   FINAL CLINICAL IMPRESSION(S) / ED DIAGNOSES  Final diagnoses:  Rash     ED Discharge Orders         Ordered    triamcinolone (KENALOG) 0.025 % ointment  2 times daily     07/29/18 1617           Note: This dictation was prepared with Dragon dictation along with smaller phrase technology. Any transcriptional errors that result from this process are unintentional.        Renford Dills, NP 07/29/18 [phone]

## 2018-07-29 NOTE — Discharge Instructions (Signed)
Use  medication as prescribed. Avoid scratching.  Follow up with your primary care physician this week as needed. Return to Urgent care for new or worsening concerns.   

## 2018-07-29 NOTE — ED Triage Notes (Signed)
Pt here for rash on her neck and face that she constantly has problems with when it gets hot outside. Did try otc hydrocortisone without relief. Does state it itches.

## 2018-10-06 DIAGNOSIS — M7671 Peroneal tendinitis, right leg: Secondary | ICD-10-CM | POA: Diagnosis not present

## 2018-10-06 DIAGNOSIS — M67371 Transient synovitis, right ankle and foot: Secondary | ICD-10-CM | POA: Diagnosis not present

## 2018-10-06 DIAGNOSIS — M659 Synovitis and tenosynovitis, unspecified: Secondary | ICD-10-CM | POA: Diagnosis not present

## 2019-01-09 ENCOUNTER — Telehealth (INDEPENDENT_AMBULATORY_CARE_PROVIDER_SITE_OTHER): Payer: Self-pay | Admitting: Orthopedic Surgery

## 2019-01-09 NOTE — Telephone Encounter (Signed)
Patient came into the office wanted to inform Dr.Duda that its a possibility we will receive [facility] paperwork for her husband. She did state they didn't specify who they sent it to. She is thinking since Dr.Duda treated him last it might come to [place]. Patient would like to speak with Dr.Duda please call patient to advise.

## 2019-01-11 NOTE — Telephone Encounter (Signed)
I called pt and there was no answer not able to leave a voicemail. I will hold message and try again later.

## 2019-01-13 NOTE — Telephone Encounter (Signed)
Unable to reach pt or leave a message will sign off and await return call.

## 2019-01-17 ENCOUNTER — Ambulatory Visit (INDEPENDENT_AMBULATORY_CARE_PROVIDER_SITE_OTHER): Payer: Medicare Other

## 2019-01-17 ENCOUNTER — Ambulatory Visit (INDEPENDENT_AMBULATORY_CARE_PROVIDER_SITE_OTHER): Payer: Medicare Other | Admitting: Orthopedic Surgery

## 2019-01-17 ENCOUNTER — Encounter (INDEPENDENT_AMBULATORY_CARE_PROVIDER_SITE_OTHER): Payer: Self-pay | Admitting: Orthopedic Surgery

## 2019-01-17 VITALS — Ht 65.5 in | Wt 175.0 lb

## 2019-01-17 DIAGNOSIS — M25571 Pain in right ankle and joints of right foot: Secondary | ICD-10-CM

## 2019-01-17 DIAGNOSIS — M79671 Pain in right foot: Secondary | ICD-10-CM

## 2019-01-23 ENCOUNTER — Encounter (INDEPENDENT_AMBULATORY_CARE_PROVIDER_SITE_OTHER): Payer: Self-pay | Admitting: Orthopedic Surgery

## 2019-01-23 DIAGNOSIS — M25571 Pain in right ankle and joints of right foot: Secondary | ICD-10-CM | POA: Diagnosis not present

## 2019-01-23 DIAGNOSIS — M79671 Pain in right foot: Secondary | ICD-10-CM | POA: Diagnosis not present

## 2019-01-23 MED ORDER — LIDOCAINE HCL 1 % IJ SOLN
2.0000 mL | INTRAMUSCULAR | Status: AC | PRN
  Administered 2019-01-23: 2 mL

## 2019-01-23 MED ORDER — METHYLPREDNISOLONE ACETATE 40 MG/ML IJ SUSP
40.0000 mg | INTRAMUSCULAR | Status: AC | PRN
  Administered 2019-01-23: 40 mg via INTRA_ARTICULAR

## 2019-01-23 NOTE — Progress Notes (Signed)
Office Visit Note   Patient: Sierra Becker           Date of Birth: 08/16/1941           MRN: [id number] Visit Date: 01/17/2019              Requested by: [facility] [address] [place], [place] [address] PCP: [facility]  Chief Complaint  Patient presents with  . Right Ankle - Pain      HPI: Patient is a 78 year old woman who complains of right ankle pain that gives out while walking pain anteriorly to the ankle.  She states this is been a chronic problem she is not taking any medications she states she cannot walk for long distance.  She states she feels like she is going to fall.  Assessment & Plan: Visit Diagnoses:  1. Right foot pain   2. Pain in right ankle and joints of right foot     Plan: Ankle was injected will follow-up in 4 weeks.  Follow-Up Instructions: Return in about 4 weeks (around 02/14/2019).   Ortho Exam  Patient is alert, oriented, no adenopathy, well-dressed, normal affect, normal respiratory effort. On examination patient has good pulses she has a stable anterior drawer she has good ankle and subtalar motion.  She is tender to palpation anteriorly over the ankle.  Imaging: No results found. No images are attached to the encounter.  Labs: Lab Results  Component Value Date   LABORGA ESCHERICHIA COLI 12/09/2011     Lab Results  Component Value Date   ALBUMIN 4.4 12/09/2011    Body mass index is 28.68 kg/m.  Orders:  Orders Placed This Encounter  Procedures  . XR Ankle 2 Views Right   No orders of the defined types were placed in this encounter.    Procedures: Medium Joint Inj: R ankle on 01/23/2019 9:24 AM Indications: pain and diagnostic evaluation Details: 22 G 1.5 in needle, anteromedial approach Medications: 2 mL lidocaine 1 %; 40 mg methylPREDNISolone acetate 40 MG/ML Outcome: tolerated well, no immediate complications Procedure, treatment alternatives, risks and benefits explained, specific  risks discussed. Consent was given by the patient. Immediately prior to procedure a time out was called to verify the correct patient, procedure, equipment, support staff and site/side marked as required. Patient was prepped and draped in the usual sterile fashion.      Clinical Data: No additional findings.  ROS:  All other systems negative, except as noted in the HPI. Review of Systems  Objective: Vital Signs: Ht 5' 5.5" (1.664 m)   Wt 175 lb (79.4 kg)   BMI 28.68 kg/m   Specialty Comments:  No specialty comments available.  PMFS History: Patient Active Problem List   Diagnosis Date Noted  . Deficiency of other specified B group vitamins 02/18/2018  . Anemia, unspecified 02/18/2018  . Gastro-esophageal reflux disease without esophagitis 04/28/2016   History reviewed. No pertinent past medical history.  Family History  Problem Relation Age of Onset  . Diabetes Maternal Aunt   . Diabetes Maternal Grandmother   . Heart disease Father     Past Surgical History:  Procedure Laterality Date  . ABDOMINAL HYSTERECTOMY     Social History   Occupational History  . Not on file  Tobacco Use  . Smoking status: Never Smoker  . Smokeless tobacco: Never Used  Substance and Sexual Activity  . Alcohol use: No  . Drug use: Never  . Sexual activity: Not  Currently    Birth control/protection: Surgical    Comment: HYSTERECTOMY

## 2019-02-10 DIAGNOSIS — D649 Anemia, unspecified: Secondary | ICD-10-CM | POA: Diagnosis not present

## 2019-02-16 ENCOUNTER — Ambulatory Visit (INDEPENDENT_AMBULATORY_CARE_PROVIDER_SITE_OTHER): Payer: Medicare Other | Admitting: Orthopedic Surgery

## 2019-04-03 DIAGNOSIS — G5791 Unspecified mononeuropathy of right lower limb: Secondary | ICD-10-CM | POA: Diagnosis not present

## 2019-04-03 DIAGNOSIS — M659 Synovitis and tenosynovitis, unspecified: Secondary | ICD-10-CM | POA: Diagnosis not present

## 2019-04-03 DIAGNOSIS — M67371 Transient synovitis, right ankle and foot: Secondary | ICD-10-CM | POA: Diagnosis not present

## 2019-04-14 DIAGNOSIS — M67371 Transient synovitis, right ankle and foot: Secondary | ICD-10-CM | POA: Diagnosis not present

## 2019-04-14 DIAGNOSIS — M7751 Other enthesopathy of right foot: Secondary | ICD-10-CM | POA: Diagnosis not present

## 2019-06-30 ENCOUNTER — Other Ambulatory Visit: Payer: Self-pay

## 2019-08-03 DIAGNOSIS — K449 Diaphragmatic hernia without obstruction or gangrene: Secondary | ICD-10-CM | POA: Diagnosis not present

## 2019-08-03 DIAGNOSIS — K210 Gastro-esophageal reflux disease with esophagitis: Secondary | ICD-10-CM | POA: Diagnosis not present

## 2019-09-13 DIAGNOSIS — R1110 Vomiting, unspecified: Secondary | ICD-10-CM | POA: Diagnosis not present

## 2019-09-13 DIAGNOSIS — K219 Gastro-esophageal reflux disease without esophagitis: Secondary | ICD-10-CM | POA: Diagnosis not present

## 2019-10-03 DIAGNOSIS — M67371 Transient synovitis, right ankle and foot: Secondary | ICD-10-CM | POA: Diagnosis not present

## 2020-12-09 ENCOUNTER — Ambulatory Visit: Payer: Medicare Other | Admitting: Podiatry

## 2020-12-10 ENCOUNTER — Telehealth: Payer: Self-pay | Admitting: *Deleted

## 2020-12-10 NOTE — Telephone Encounter (Signed)
Patient is wishing to cancel appt.,for 12/11/20. Please call back to reschedule.

## 2020-12-11 ENCOUNTER — Ambulatory Visit: Payer: Medicare Other | Admitting: Podiatry
